# Patient Record
Sex: Female | Born: 1995 | Race: White | Hispanic: No | Marital: Single | Smoking: Never smoker
Health system: Southern US, Community
[De-identification: ages and names within clinical notes are randomized; demographics above are authoritative.]

## PROBLEM LIST (undated history)

## (undated) DIAGNOSIS — Z973 Presence of spectacles and contact lenses: Secondary | ICD-10-CM

## (undated) DIAGNOSIS — F32A Depression, unspecified: Secondary | ICD-10-CM

## (undated) DIAGNOSIS — L709 Acne, unspecified: Secondary | ICD-10-CM

## (undated) DIAGNOSIS — F329 Major depressive disorder, single episode, unspecified: Secondary | ICD-10-CM

## (undated) HISTORY — DX: Major depressive disorder, single episode, unspecified: F32.9

## (undated) HISTORY — DX: Depression, unspecified: F32.A

## (undated) HISTORY — DX: Acne, unspecified: L70.9

## (undated) HISTORY — DX: Presence of spectacles and contact lenses: Z97.3

---
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
## 2017-03-04 ENCOUNTER — Ambulatory Visit (HOSPITAL_COMMUNITY): Payer: Self-pay | Admitting: Psychiatry

## 2017-10-26 ENCOUNTER — Other Ambulatory Visit (HOSPITAL_COMMUNITY): Payer: Self-pay | Admitting: Psychiatry

## 2017-10-26 DIAGNOSIS — F341 Dysthymic disorder: Secondary | ICD-10-CM

## 2017-12-06 ENCOUNTER — Other Ambulatory Visit (HOSPITAL_COMMUNITY): Payer: Self-pay | Admitting: Psychiatry

## 2017-12-06 DIAGNOSIS — F341 Dysthymic disorder: Secondary | ICD-10-CM

## 2018-03-03 ENCOUNTER — Ambulatory Visit (HOSPITAL_COMMUNITY): Payer: 59 | Admitting: Psychiatry

## 2018-03-24 ENCOUNTER — Ambulatory Visit (INDEPENDENT_AMBULATORY_CARE_PROVIDER_SITE_OTHER): Payer: 59 | Admitting: Psychiatry

## 2018-03-24 ENCOUNTER — Encounter (HOSPITAL_COMMUNITY): Payer: Self-pay | Admitting: Psychiatry

## 2018-03-24 VITALS — BP 120/82 | HR 72 | Resp 12 | Ht 64.0 in | Wt 185.8 lb

## 2018-03-24 DIAGNOSIS — F341 Dysthymic disorder: Secondary | ICD-10-CM

## 2018-03-24 MED ORDER — BUPROPION HCL ER (XL) 300 MG PO TB24: 300.0000 mg | ORAL_TABLET | Freq: Every day | ORAL | 2 refills | Status: AC

## 2018-03-24 NOTE — Progress Notes (Signed)
BH MD/PA/NP OP Progress Note  03/24/2018 2:42 PM Hannah KarvonenMakenna S Russell  MRN:  [id number]  Chief Complaint:  Chief Complaint    Depression; Follow-up     HPI: Patient is a 23 year old female diagnosed with dysthymic disorder who presents today for follow-up visit.  Patient adds that she knows she needs to take her medications as prescribed as when she is not on medications, she struggles with her ability to enjoy activities, feels tired, struggles with her focus.  Patient adds that she has been taking her Wellbutrin regularly for the past few weeks and has noticed that she is doing fairly well in regards to her mood, her ability to focus, her energy.  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  Patient adds that she has done well in college, has a good relationship with her parents, has friends but is a little nervous about going so far away [place] to [place].  She adds that is always been her dream to work there and is excited about it.  Patient denies any depressive symptoms, any symptoms of psychosis, any substance use.  She also denies any activating features on the Wellbutrin.  She denies any thoughts of self-harm or harm to others.  She also denies any side effects on the medication Visit Diagnosis:    ICD-10-CM   1. Dysthymia F34.1 buPROPion (WELLBUTRIN XL) 300 MG 24 hr tablet    Past Psychiatric History: Unchanged from previous visit  Past Medical History:  Past Medical History:  Diagnosis Date  . Acne   . Depression   . Wears glasses    History reviewed. No pertinent surgical history.  Family Psychiatric History: Unchanged from previous visit  Family History:  Family History  Problem Relation Age of Onset  . Bipolar disorder Mother   . 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 Social History   Socioeconomic History  . Marital status: Single    Spouse name: Not on file  . Number of children: Not on file  . Years of education: Not on file  . Highest education level: Not on file  Occupational History  . Not on file  Social Needs  . Financial resource strain: Not on file  . Food insecurity:    Worry: Not on file    Inability: Not on file  . Transportation needs:    Medical: Not on file    Non-medical: Not on file  Tobacco Use  . Smoking status: Never Smoker  . Smokeless tobacco: Never Used  Substance and Sexual Activity  . Alcohol use: No  . Drug use: No  . Sexual activity: Yes    Birth control/protection: None  Lifestyle  . Physical activity:    Days per week: Not on file    Minutes per session: Not on file  . Stress: Not on file  Relationships  . Social connections:    Talks on phone: Not on file    Gets together: Not on file    Attends religious service: Not on file    Active member of club or organization: Not on file    Attends meetings of clubs or organizations: Not on file    Relationship status: Not on file  Other Topics Concern  . Not on  file  Social History Narrative  . Not on file    Allergies: No Known Allergies  Metabolic Disorder Labs: No results found for: HGBA1C, MPG No results found for: PROLACTIN No results found for: CHOL, TRIG, HDL, CHOLHDL, VLDL, LDLCALC No results found for: TSH  Therapeutic Level Labs: No results found for: LITHIUM No results found for: VALPROATE No components found for:  CBMZ  Current Medications: Current Outpatient Medications  Medication Sig Dispense Refill  . buPROPion (WELLBUTRIN XL) 300 MG 24 hr tablet TAKE 1 TABLET BY MOUTH EVERY DAY (Patient not taking: Reported on 03/24/2018) 30 tablet 6   No current facility-administered medications for this visit.      Musculoskeletal: Strength & Muscle Tone: within normal limits Gait & Station: normal Patient leans:  N/A  Psychiatric Specialty Exam: Review of Systems  Constitutional: Negative.  Negative for fever, malaise/fatigue and weight loss.  HENT: Negative.  Negative for congestion, hearing loss and sore throat.   Eyes: Negative.  Negative for blurred vision, double vision, discharge and redness.  Gastrointestinal: Negative.  Negative for abdominal pain, constipation, diarrhea, heartburn, nausea and vomiting.  Genitourinary: Negative.  Negative for dysuria.  Musculoskeletal: Negative.  Negative for falls, joint pain and myalgias.  Skin: Negative.  Negative for rash.  Neurological: Negative.  Negative for dizziness, seizures, loss of consciousness, weakness and headaches.  Endo/Heme/Allergies: Negative.  Negative for environmental allergies.  Psychiatric/Behavioral: Negative.  Negative for depression, hallucinations, memory loss, substance abuse and suicidal ideas. The patient is not nervous/anxious and does not have insomnia.     Blood pressure 120/82, pulse 72, resp. rate 12, height 5\' 4"  (1.626 m), weight 185 lb 12.8 oz (84.3 kg).Body mass index is 31.89 kg/m.  General Appearance: Casual  Eye Contact:  Good  Speech:  Clear and Coherent and Normal Rate  Volume:  Normal  Mood:  Euthymic  Affect:  Appropriate, Congruent and Full Range  Thought Process:  Coherent, Goal Directed and Descriptions of Associations: Intact  Orientation:  Full (Time, Place, and Person)  Thought Content: WDL   Suicidal Thoughts:  No  Homicidal Thoughts:  No  Memory:  Immediate;   Fair Recent;   Fair Remote;   Fair  Judgement:  Intact  Insight:  Fair  Psychomotor Activity:  Normal  Concentration:  Concentration: Good and Attention Span: Good  Recall:  Good  Fund of Knowledge: Good  Language: Good  Akathisia:  No  Handed:  Right  AIMS (if indicated): not done  Assets:  Communication Skills Desire for Improvement Financial Resources/Insurance Housing Leisure Time Physical Health Social  Support Talents/Skills Transportation  ADL's:  Intact  Cognition: WNL  Sleep:  Good   Screenings:   Assessment and Plan:  Dysthymic disorder Patient can continue Wellbutrin XL 300 mg 1 in the morning to help with mood.  Patient denies any side effects on the medication. Also discussed coping mechanisms, the need for medication compliance, strategies to help patient as she is starting an internship at [employer], has never been away from home that far away.  Call as necessary Follow up in 6 months 50% of this visit was spent in discussing coping strategies as patient is going to be away from home, discussed the need to be on medication.  Also discussed substance use disorders along with sleep hygiene at length at this visit as patient is going to be away from home.  This visit was of low medical complexity and was a 20-minute visit    Nelly RoutArchana Satchel Heidinger, MD  03/24/2018, 2:42 PM

## 2018-03-28 DIAGNOSIS — Z3009 Encounter for other general counseling and advice on contraception: Secondary | ICD-10-CM | POA: Diagnosis not present

## 2018-03-28 DIAGNOSIS — Z1159 Encounter for screening for other viral diseases: Secondary | ICD-10-CM | POA: Diagnosis not present

## 2018-03-28 DIAGNOSIS — Z23 Encounter for immunization: Secondary | ICD-10-CM | POA: Diagnosis not present

## 2018-03-28 DIAGNOSIS — Z118 Encounter for screening for other infectious and parasitic diseases: Secondary | ICD-10-CM | POA: Diagnosis not present
